# Patient Record
Sex: Female | Born: 1970 | Race: White | Hispanic: No | Marital: Married
Health system: Southern US, Community
[De-identification: ages and names within clinical notes are randomized; demographics above are authoritative.]

---

## 2017-08-12 ENCOUNTER — Other Ambulatory Visit: Payer: Self-pay | Admitting: Gastroenterology

## 2017-08-12 DIAGNOSIS — R112 Nausea with vomiting, unspecified: Secondary | ICD-10-CM

## 2017-08-12 DIAGNOSIS — R1011 Right upper quadrant pain: Secondary | ICD-10-CM

## 2017-08-12 NOTE — Progress Notes (Signed)
Laura Golladay MD 

## 2017-08-21 ENCOUNTER — Encounter (HOSPITAL_COMMUNITY)
Admission: RE | Admit: 2017-08-21 | Discharge: 2017-08-21 | Disposition: A | Discharge status: Home / Self Care | Payer: BLUE CROSS/BLUE SHIELD | Source: Ambulatory Visit | Attending: Gastroenterology | Admitting: Gastroenterology

## 2017-08-21 ENCOUNTER — Ambulatory Visit (HOSPITAL_COMMUNITY)
Admission: RE | Admit: 2017-08-21 | Discharge: 2017-08-21 | Disposition: A | Discharge status: Home / Self Care | Payer: BLUE CROSS/BLUE SHIELD | Source: Ambulatory Visit | Attending: Gastroenterology | Admitting: Gastroenterology

## 2017-08-21 DIAGNOSIS — R1011 Right upper quadrant pain: Secondary | ICD-10-CM | POA: Insufficient documentation

## 2017-08-21 DIAGNOSIS — R112 Nausea with vomiting, unspecified: Secondary | ICD-10-CM

## 2017-08-21 DIAGNOSIS — R110 Nausea: Secondary | ICD-10-CM | POA: Diagnosis present

## 2017-08-21 MED ORDER — TECHNETIUM TC 99M MEBROFENIN IV KIT
5.1000 | PACK | Freq: Once | INTRAVENOUS | Status: AC | PRN
  Administered 2017-08-21: 5.1 via INTRAVENOUS

## 2018-09-16 IMAGING — US US ABDOMEN COMPLETE
1 series · 14 of 25 positions shown · non-contrast
Comparison: None.

CLINICAL DATA: Right upper quadrant pain and nausea. Symptoms for 6
weeks.

EXAM:
ABDOMEN ULTRASOUND COMPLETE

[Series 1: us abdomen complete · 0.15mm/px · 14 of 99 slices shown]
[im 1/99]
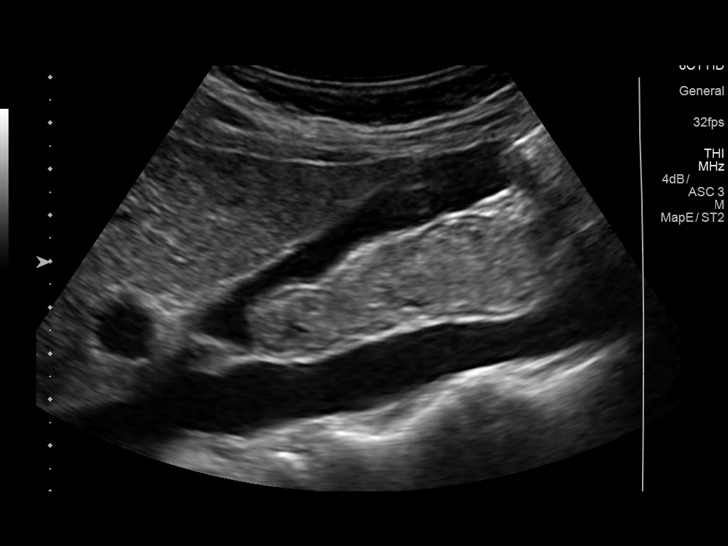
[im 9/99]
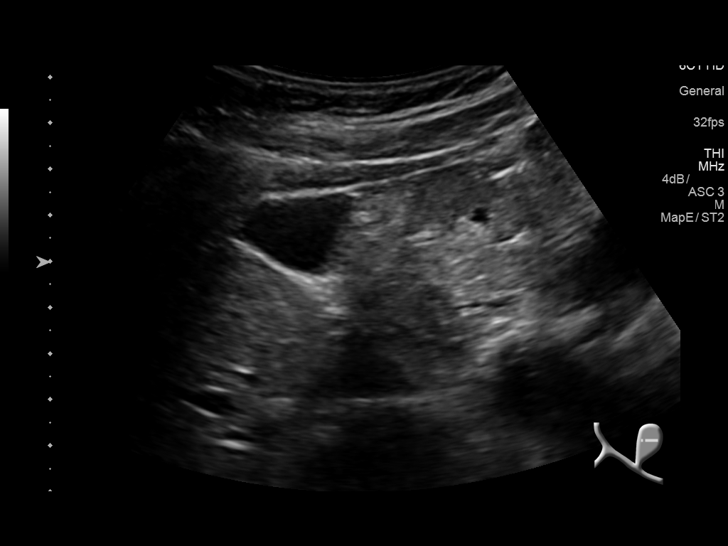
[im 17/99]
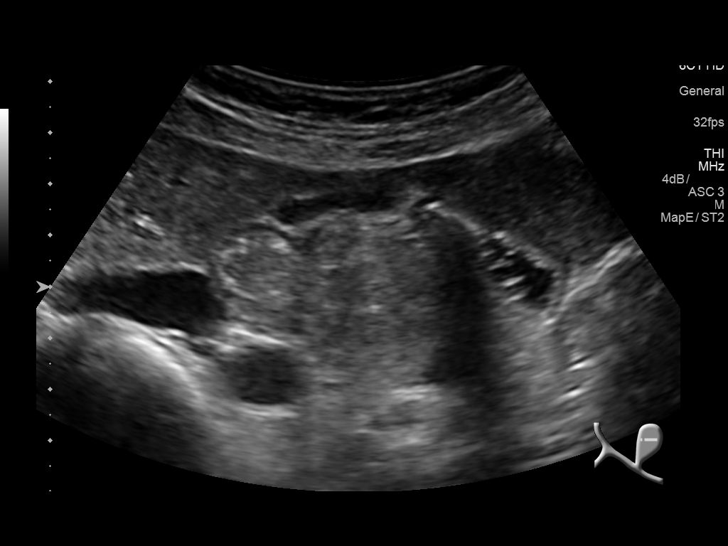
[im 25/99]
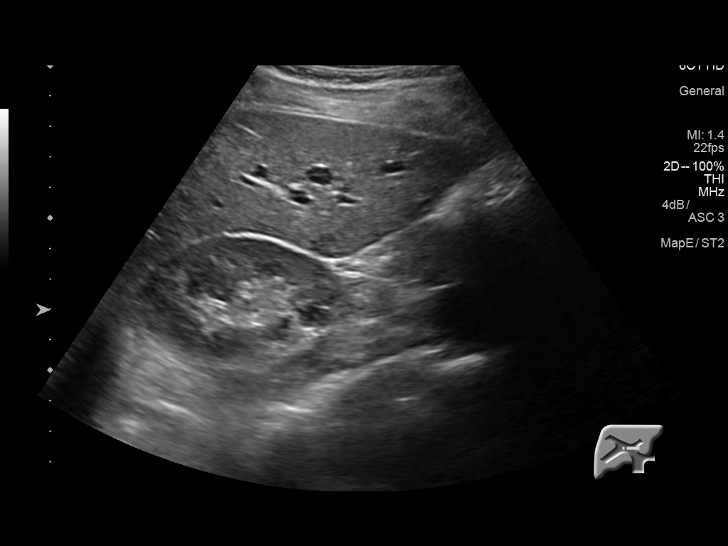
[im 33/99]
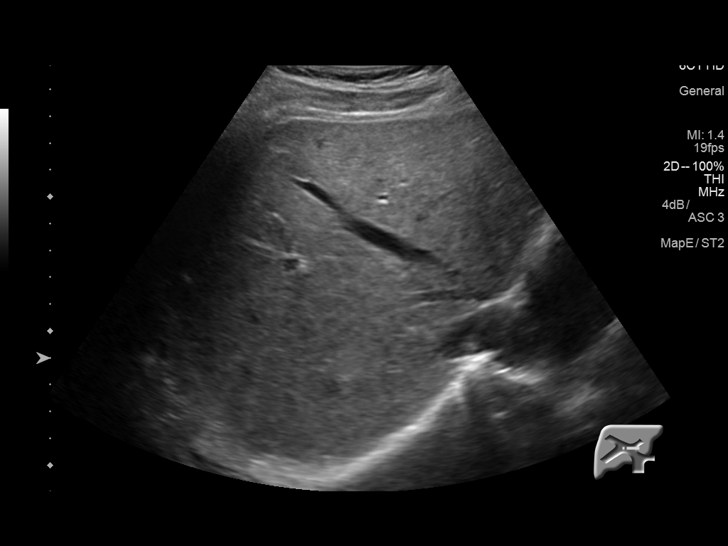
[im 37/99]
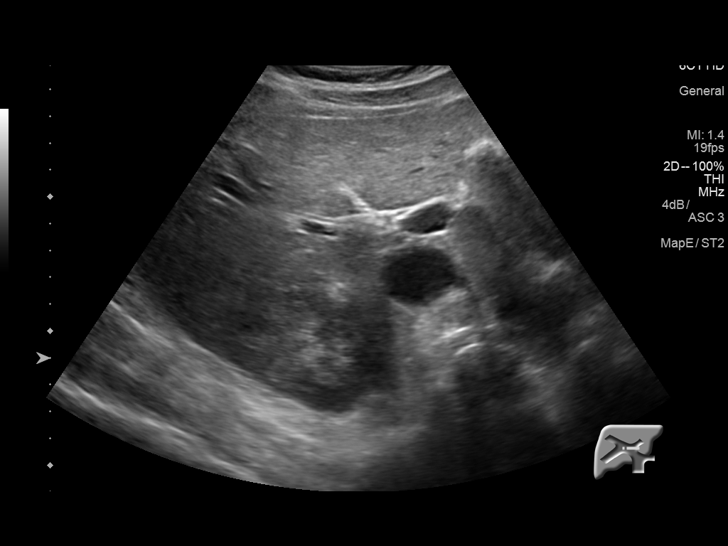
[im 45/99]
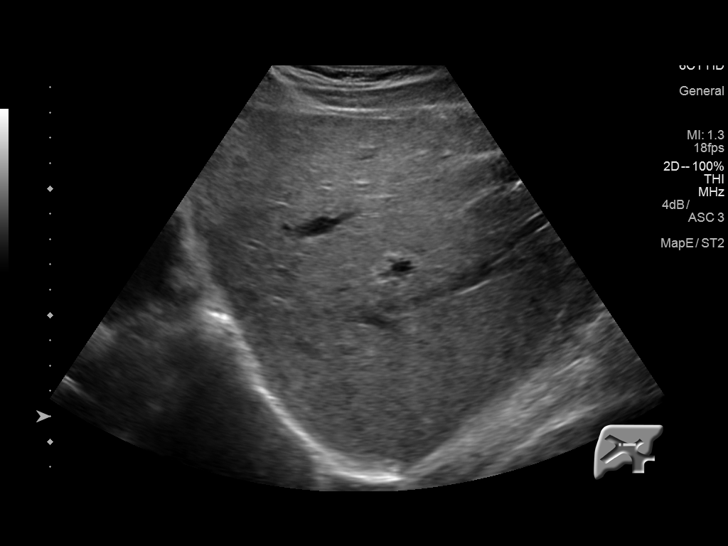
[im 54/99]
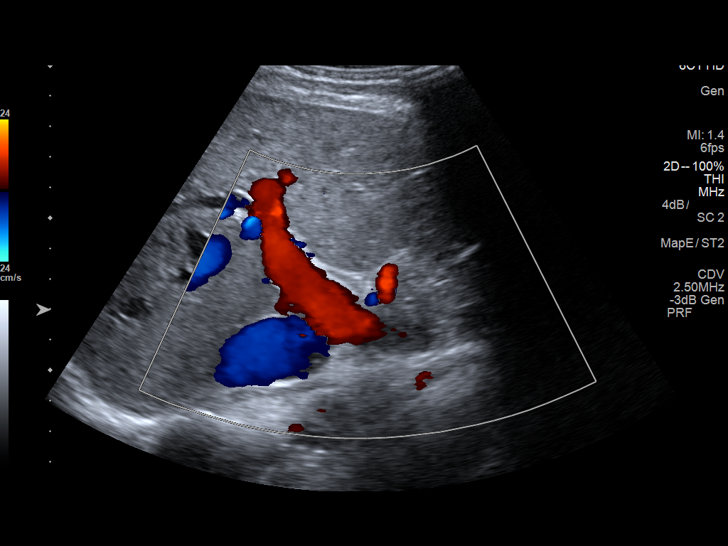
[im 62/99]
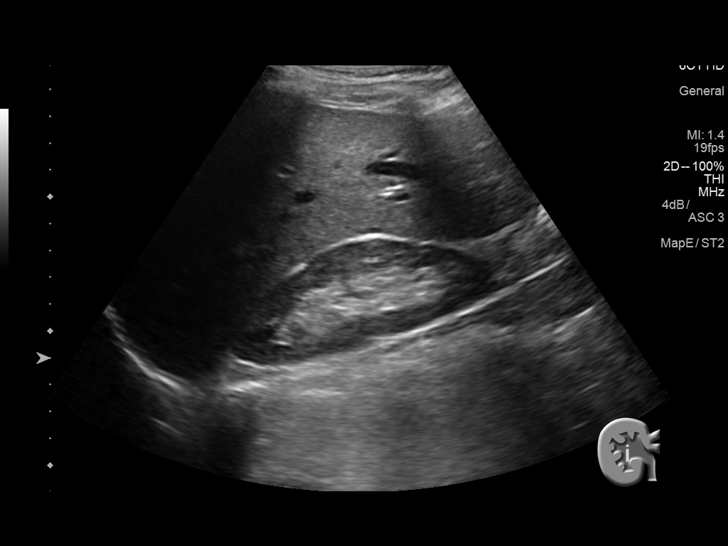
[im 66/99]
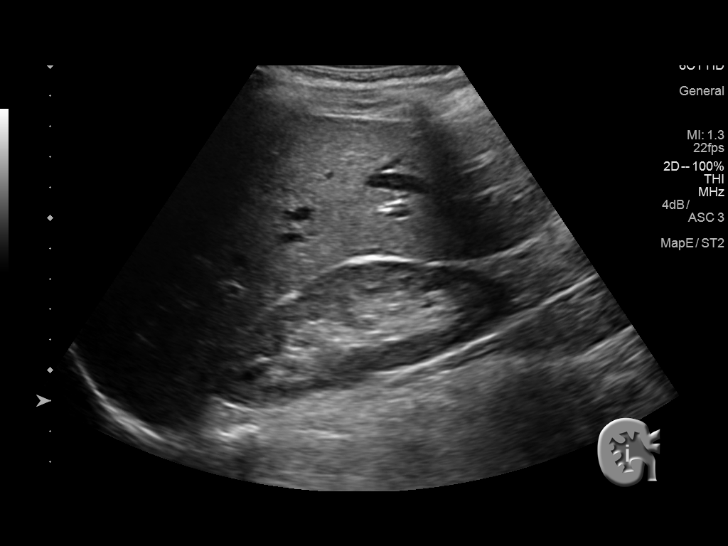
[im 74/99]
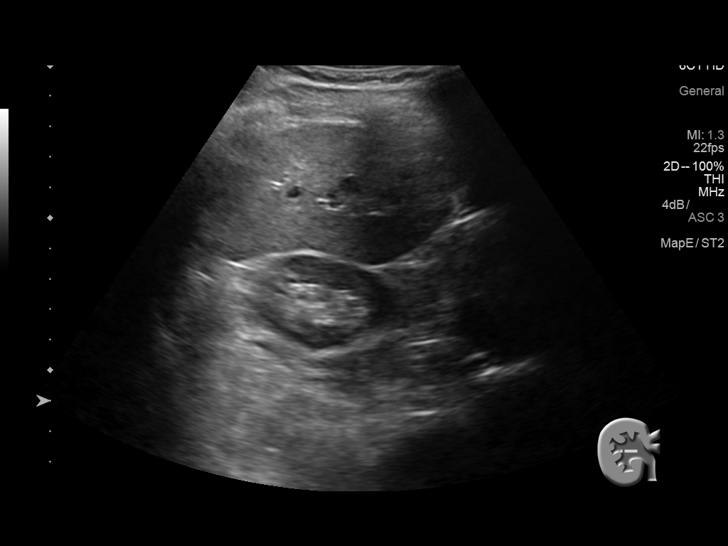
[im 82/99]
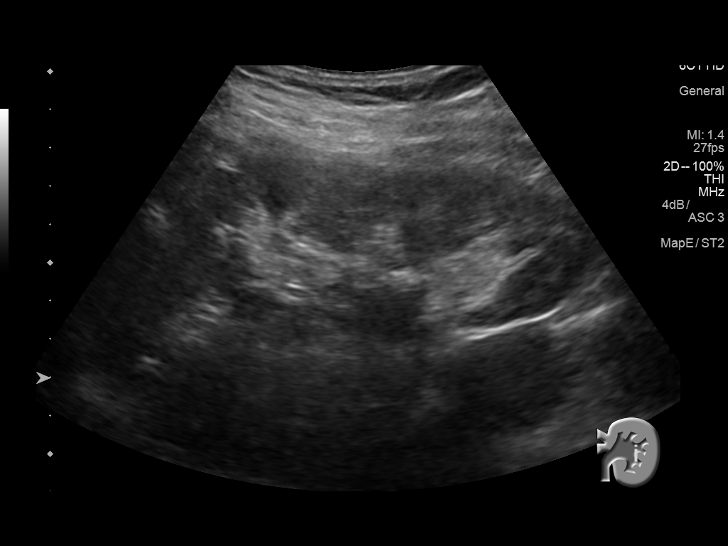
[im 90/99]
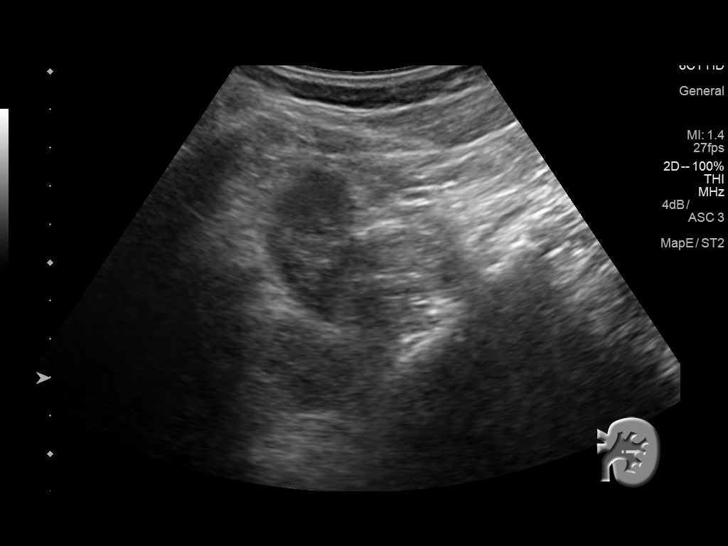
[im 99/99]
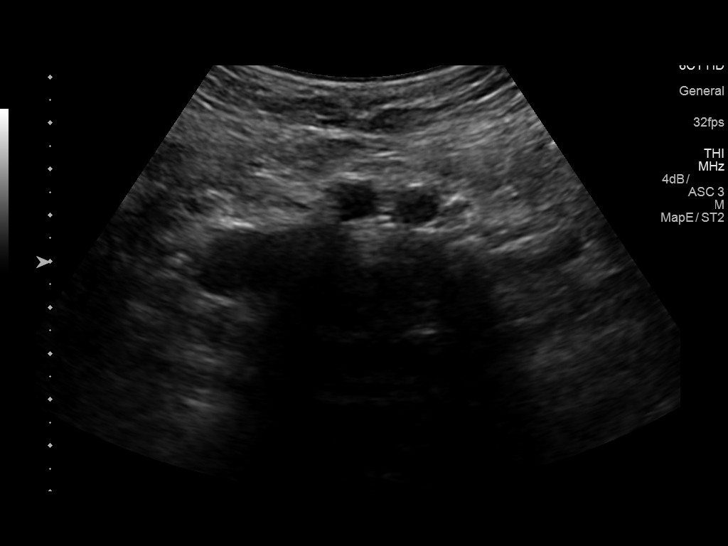

[14 of 25 positions shown; findings below may reference images not displayed]

FINDINGS: Gallbladder: No gallstones or wall thickening visualized. No
sonographic Murphy sign noted by sonographer.

Common bile duct: Diameter: 1 mm where seen

Liver: No focal lesion identified. Within normal limits in
parenchymal echogenicity. Portal vein is patent on color Doppler
imaging with normal direction of blood flow towards the liver.

IVC: No abnormality visualized.

Pancreas: Visualized portions are unremarkable. The head is
prominent on gallbladder views but homogeneous and normal size on
dedicated transverse views.

Spleen: Size and appearance within normal limits.

Right Kidney: Length: 11 cm. Echogenicity within normal limits. No
mass or hydronephrosis visualized.

Left Kidney: Length: 11 cm. Echogenicity within normal limits. No
mass or hydronephrosis visualized.

Abdominal aorta:  Unremarkable.  No aneurysm visualized.
IMPRESSION: Negative abdominal ultrasound.

## 2023-12-23 ENCOUNTER — Other Ambulatory Visit: Payer: Self-pay | Admitting: Gastroenterology

## 2023-12-23 DIAGNOSIS — R1011 Right upper quadrant pain: Secondary | ICD-10-CM

## 2023-12-23 DIAGNOSIS — R7989 Other specified abnormal findings of blood chemistry: Secondary | ICD-10-CM

## 2023-12-29 ENCOUNTER — Other Ambulatory Visit: Payer: BLUE CROSS/BLUE SHIELD

## 2023-12-30 ENCOUNTER — Ambulatory Visit
Admission: RE | Admit: 2023-12-30 | Discharge: 2023-12-30 | Disposition: A | Discharge status: Home / Self Care | Payer: Self-pay | Source: Ambulatory Visit | Attending: Gastroenterology | Admitting: Gastroenterology

## 2023-12-30 DIAGNOSIS — R7989 Other specified abnormal findings of blood chemistry: Secondary | ICD-10-CM

## 2023-12-30 DIAGNOSIS — R1011 Right upper quadrant pain: Secondary | ICD-10-CM
